# Patient Record
Sex: Male | Born: 1975
Health system: Southern US, Community
[De-identification: ages and names within clinical notes are randomized; demographics above are authoritative.]

## PROBLEM LIST (undated history)

## (undated) DIAGNOSIS — D141 Benign neoplasm of larynx: Secondary | ICD-10-CM

## (undated) DIAGNOSIS — J3089 Other allergic rhinitis: Secondary | ICD-10-CM

## (undated) DIAGNOSIS — K219 Gastro-esophageal reflux disease without esophagitis: Secondary | ICD-10-CM

## (undated) HISTORY — DX: Benign neoplasm of larynx: D14.1

## (undated) HISTORY — PX: OTHER SURGICAL HISTORY: SHX169

## (undated) HISTORY — DX: Other allergic rhinitis: J30.89

## (undated) HISTORY — DX: Gastro-esophageal reflux disease without esophagitis: K21.9

---

## 2012-07-04 ENCOUNTER — Ambulatory Visit (INDEPENDENT_AMBULATORY_CARE_PROVIDER_SITE_OTHER): Payer: BC Managed Care – PPO | Admitting: General Surgery

## 2012-07-27 ENCOUNTER — Encounter (INDEPENDENT_AMBULATORY_CARE_PROVIDER_SITE_OTHER): Payer: Self-pay | Admitting: General Surgery

## 2012-07-27 ENCOUNTER — Ambulatory Visit (INDEPENDENT_AMBULATORY_CARE_PROVIDER_SITE_OTHER): Payer: BC Managed Care – PPO | Admitting: General Surgery

## 2012-07-27 ENCOUNTER — Encounter (INDEPENDENT_AMBULATORY_CARE_PROVIDER_SITE_OTHER): Payer: Self-pay

## 2012-07-27 VITALS — BP 120/77 | HR 74 | Temp 97.6°F | Resp 16 | Ht 72.0 in | Wt 176.2 lb

## 2012-07-27 DIAGNOSIS — L723 Sebaceous cyst: Secondary | ICD-10-CM

## 2012-07-27 NOTE — Patient Instructions (Signed)
Please call if the area becomes infected prior to your surgery.

## 2012-07-27 NOTE — Progress Notes (Signed)
Patient ID: Steven Simmonds, Steven Potter, male   DOB: 1976-08-03, 36 y.o.   MRN: [id number]  Chief Complaint  Patient presents with  . Cyst    back    HPI Steven Simmonds, Steven Potter is a 36 y.o. male.   HPI  He is referred by Dr. Kirby Funk for evaluation of a cyst on the back. He states it has been infected and draining some. He states it looks fine now. Many years ago he had attempted removal of it but the area did not heal well.  Past Medical History  Diagnosis Date  . Recurrent glottic respiratory papillomatosis   . GERD (gastroesophageal reflux disease)   . Perennial allergic rhinitis     Past Surgical History  Procedure Date  . Removal of vocal chord papillomas   . Infected sebaceous cyst     back    Family History  Problem Relation Age of Onset  . Hypertension Father   . Cancer Brother     lung  . Heart disease Paternal Grandfather     AD    Social History History  Substance Use Topics  . Smoking status: Never Smoker   . Smokeless tobacco: Not on file  . Alcohol Use: Yes    No Known Allergies  Current Outpatient Prescriptions  Medication Sig Dispense Refill  . cetirizine (ZYRTEC) 10 MG tablet Take 10 mg by mouth daily.      . lansoprazole (PREVACID) 30 MG capsule Take 30 mg by mouth daily. Once daily before meal.      . Multiple Vitamin (MULTIVITAMIN) tablet Take 1 tablet by mouth daily.        Review of Systems Review of Systems  Constitutional: Negative.   Respiratory: Negative.   Cardiovascular: Negative.     Blood pressure 120/77, pulse 74, temperature 97.6 F (36.4 C), temperature source Temporal, resp. rate 16, height 6' (1.829 m), weight 176 lb 3.2 oz (79.924 kg).  Physical Exam Physical Exam  Constitutional: He appears well-developed and well-nourished. No distress.  Musculoskeletal:       In the left upper back scar is present and lateral to this was a 1.5 cm mobile subcutaneous mass with no erythema or drainage.    Data Reviewed Notes from Dr.  Jone Baseman office.  Assessment    Recurrent epidermoid cyst of back that is intermittently infected. No evidence of infection at this time.    Plan    Excision of recurrent epidermoid cyst of back. The procedure and risks have been discussed with him. Risks include but limited to bleeding, infection, wound healing problems.       Rocio Wolak J 07/27/2012, 4:21 PM

## 2012-08-28 ENCOUNTER — Ambulatory Visit (HOSPITAL_BASED_OUTPATIENT_CLINIC_OR_DEPARTMENT_OTHER)
Admission: RE | Admit: 2012-08-28 | Discharge: 2012-08-28 | Disposition: A | Discharge status: Home / Self Care | Payer: BC Managed Care – PPO | Source: Ambulatory Visit | Attending: General Surgery | Admitting: General Surgery

## 2012-08-28 ENCOUNTER — Encounter (HOSPITAL_BASED_OUTPATIENT_CLINIC_OR_DEPARTMENT_OTHER)
Admission: RE | Disposition: A | Discharge status: Home / Self Care | Payer: Self-pay | Source: Ambulatory Visit | Attending: General Surgery

## 2012-08-28 ENCOUNTER — Encounter (HOSPITAL_BASED_OUTPATIENT_CLINIC_OR_DEPARTMENT_OTHER): Payer: Self-pay

## 2012-08-28 DIAGNOSIS — L723 Sebaceous cyst: Secondary | ICD-10-CM | POA: Insufficient documentation

## 2012-08-28 HISTORY — PX: MASS EXCISION: SHX2000

## 2012-08-28 SURGERY — MINOR EXCISION OF MASS
Anesthesia: LOCAL | Site: Back | Wound class: Clean

## 2012-08-28 MED ORDER — SODIUM BICARBONATE 4 % IV SOLN
INTRAVENOUS | Status: DC | PRN
  Administered 2012-08-28: 16:00:00

## 2012-08-28 SURGICAL SUPPLY — 27 items
APL SKNCLS STERI-STRIP NONHPOA (GAUZE/BANDAGES/DRESSINGS) ×1
BENZOIN TINCTURE PRP APPL 2/3 (GAUZE/BANDAGES/DRESSINGS) ×2 IMPLANT
BLADE SURG 10 STRL SS (BLADE) IMPLANT
BLADE SURG 15 STRL LF DISP TIS (BLADE) ×1 IMPLANT
BLADE SURG 15 STRL SS (BLADE) ×2
CHLORAPREP W/TINT 26ML (MISCELLANEOUS) ×2 IMPLANT
CLOTH BEACON ORANGE TIMEOUT ST (SAFETY) ×1 IMPLANT
DRSG TEGADERM 4X4.75 (GAUZE/BANDAGES/DRESSINGS) ×1 IMPLANT
ELECT COATED BLADE 2.86 ST (ELECTRODE) IMPLANT
ELECT REM PT RETURN 9FT ADLT (ELECTROSURGICAL) ×2
ELECTRODE REM PT RTRN 9FT ADLT (ELECTROSURGICAL) IMPLANT
GAUZE SPONGE 4X4 12PLY STRL LF (GAUZE/BANDAGES/DRESSINGS) ×2 IMPLANT
GLOVE BIO SURGEON STRL SZ 6.5 (GLOVE) ×1 IMPLANT
GLOVE BIOGEL PI IND STRL 8.5 (GLOVE) ×1 IMPLANT
GLOVE BIOGEL PI INDICATOR 8.5 (GLOVE)
GLOVE ECLIPSE 8.0 STRL XLNG CF (GLOVE) ×2 IMPLANT
GOWN PREVENTION PLUS XLARGE (GOWN DISPOSABLE) ×1 IMPLANT
NDL HYPO 25X1 1.5 SAFETY (NEEDLE) ×1 IMPLANT
NEEDLE HYPO 25X1 1.5 SAFETY (NEEDLE) ×2 IMPLANT
PENCIL BUTTON HOLSTER BLD 10FT (ELECTRODE) ×1 IMPLANT
SPONGE GAUZE 2X2 8PLY STRL LF (GAUZE/BANDAGES/DRESSINGS) ×1 IMPLANT
SPONGE GAUZE 4X4 12PLY (GAUZE/BANDAGES/DRESSINGS) ×1 IMPLANT
STRIP CLOSURE SKIN 1/2X4 (GAUZE/BANDAGES/DRESSINGS) ×2 IMPLANT
SUT ETHILON 3 0 PS 1 (SUTURE) ×1 IMPLANT
SUT MON AB 4-0 PC3 18 (SUTURE) IMPLANT
SUT VICRYL 3-0 CR8 SH (SUTURE) IMPLANT
SYR CONTROL 10ML LL (SYRINGE) ×2 IMPLANT

## 2012-08-28 NOTE — H&P (Signed)
  Hx:  He has a recurrent cystic lesion on the back. He states it has been infected and draining some. He states it looks fine now. Many years ago he had attempted removal of it but the area did not heal well.  He is otherwise fairly healthy.  PE: General:  WDWN in NAD  Back:  Mid to upper back scar with subcutaneous mass lateral to the scar.  Assess:  Recurrent epidermoid cyst with recurring infections.  Plan:  Excision of cyst in minor procedure room.

## 2012-08-28 NOTE — Op Note (Signed)
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 It has recurred and become intermittently infected. It is not infected at this time. He presents for removal.    Procedure Detail:  He was seen in the holding area and brought to the operating room and placed prone on the operating table. The lesion in the left upper back was sterilely prepped and draped. Local anesthetic was infiltrated superficially and deep around the area. A elliptical incision was made in a transverse fashion through the skin and subcutaneous tissue. Previous scar was incised. The cystic lesion was removed sharply with normal surrounding tissue. It measured 1.5 cm. It was sent to pathology.  Bleeding was controlled with electrocautery. Once hemostasis was adequate the wound was closed with interrupted 3-0 nylon sutures. Antibiotic ointment and sterile dressing were applied. He tolerated the procedure well without any apparent complications.  Estimated Blood Loss:  Minimal         Drains: none  Blood Given: none          Specimens: Cystic lesion from left back        Complications:  * No complications entered in OR log *         Disposition: PACU - hemodynamically stable.         Condition: stable

## 2012-08-29 ENCOUNTER — Encounter (HOSPITAL_BASED_OUTPATIENT_CLINIC_OR_DEPARTMENT_OTHER): Payer: Self-pay | Admitting: General Surgery

## 2012-08-30 ENCOUNTER — Encounter (INDEPENDENT_AMBULATORY_CARE_PROVIDER_SITE_OTHER): Payer: Self-pay | Admitting: General Surgery

## 2012-08-30 NOTE — Progress Notes (Signed)
Patient ID: Steven Simmonds, MD, male   DOB: [birth year], 36 y.o.   MRN: [id number] Pathology demonstrates a benign epidermoid inclusion cyst. I called and discussed this with him.

## 2012-09-11 ENCOUNTER — Encounter (INDEPENDENT_AMBULATORY_CARE_PROVIDER_SITE_OTHER): Payer: Self-pay | Admitting: General Surgery

## 2012-09-11 ENCOUNTER — Ambulatory Visit (INDEPENDENT_AMBULATORY_CARE_PROVIDER_SITE_OTHER): Payer: BC Managed Care – PPO | Admitting: General Surgery

## 2012-09-11 VITALS — BP 118/70 | HR 72 | Temp 98.1°F | Resp 12 | Ht 72.0 in | Wt 181.4 lb

## 2012-09-11 DIAGNOSIS — Z9889 Other specified postprocedural states: Secondary | ICD-10-CM

## 2012-09-11 NOTE — Progress Notes (Signed)
Procedure: Removal of epidermoid cyst left back  Date:  08/28/2012  Pathology:  Benign epidermal inclusion cyst  History:  He is here for his first postoperative visit and has no complaints.  Exam: General- Is in NAD.  Left upper back wound is clean and intact with sutures. Sutures were removed and benzoin and Steri-Strips were applied.  Assessment:  Wound healing well and pathology is benign.  Plan:  Return visit when necessary.

## 2012-09-11 NOTE — Patient Instructions (Signed)
Call if you have any wound problems. 

## 2017-09-14 DIAGNOSIS — J301 Allergic rhinitis due to pollen: Secondary | ICD-10-CM | POA: Diagnosis not present

## 2017-09-14 DIAGNOSIS — J3089 Other allergic rhinitis: Secondary | ICD-10-CM | POA: Diagnosis not present

## 2017-09-19 DIAGNOSIS — J301 Allergic rhinitis due to pollen: Secondary | ICD-10-CM | POA: Diagnosis not present

## 2017-09-19 DIAGNOSIS — J3081 Allergic rhinitis due to animal (cat) (dog) hair and dander: Secondary | ICD-10-CM | POA: Diagnosis not present

## 2017-09-19 DIAGNOSIS — J3089 Other allergic rhinitis: Secondary | ICD-10-CM | POA: Diagnosis not present

## 2017-09-23 DIAGNOSIS — J301 Allergic rhinitis due to pollen: Secondary | ICD-10-CM | POA: Diagnosis not present

## 2017-09-23 DIAGNOSIS — J3089 Other allergic rhinitis: Secondary | ICD-10-CM | POA: Diagnosis not present

## 2017-09-23 DIAGNOSIS — J3081 Allergic rhinitis due to animal (cat) (dog) hair and dander: Secondary | ICD-10-CM | POA: Diagnosis not present

## 2017-09-27 DIAGNOSIS — J3089 Other allergic rhinitis: Secondary | ICD-10-CM | POA: Diagnosis not present

## 2017-09-27 DIAGNOSIS — J301 Allergic rhinitis due to pollen: Secondary | ICD-10-CM | POA: Diagnosis not present

## 2017-09-27 DIAGNOSIS — J3081 Allergic rhinitis due to animal (cat) (dog) hair and dander: Secondary | ICD-10-CM | POA: Diagnosis not present

## 2017-09-30 DIAGNOSIS — J301 Allergic rhinitis due to pollen: Secondary | ICD-10-CM | POA: Diagnosis not present

## 2017-09-30 DIAGNOSIS — J3089 Other allergic rhinitis: Secondary | ICD-10-CM | POA: Diagnosis not present

## 2017-09-30 DIAGNOSIS — J3081 Allergic rhinitis due to animal (cat) (dog) hair and dander: Secondary | ICD-10-CM | POA: Diagnosis not present

## 2017-10-06 DIAGNOSIS — J301 Allergic rhinitis due to pollen: Secondary | ICD-10-CM | POA: Diagnosis not present

## 2017-10-06 DIAGNOSIS — J3089 Other allergic rhinitis: Secondary | ICD-10-CM | POA: Diagnosis not present

## 2017-10-06 DIAGNOSIS — J3081 Allergic rhinitis due to animal (cat) (dog) hair and dander: Secondary | ICD-10-CM | POA: Diagnosis not present

## 2017-10-10 DIAGNOSIS — F4323 Adjustment disorder with mixed anxiety and depressed mood: Secondary | ICD-10-CM | POA: Diagnosis not present

## 2017-10-12 DIAGNOSIS — J3081 Allergic rhinitis due to animal (cat) (dog) hair and dander: Secondary | ICD-10-CM | POA: Diagnosis not present

## 2017-10-12 DIAGNOSIS — J3089 Other allergic rhinitis: Secondary | ICD-10-CM | POA: Diagnosis not present

## 2017-10-12 DIAGNOSIS — J301 Allergic rhinitis due to pollen: Secondary | ICD-10-CM | POA: Diagnosis not present

## 2017-10-19 DIAGNOSIS — J3081 Allergic rhinitis due to animal (cat) (dog) hair and dander: Secondary | ICD-10-CM | POA: Diagnosis not present

## 2017-10-19 DIAGNOSIS — J3089 Other allergic rhinitis: Secondary | ICD-10-CM | POA: Diagnosis not present

## 2017-10-19 DIAGNOSIS — J301 Allergic rhinitis due to pollen: Secondary | ICD-10-CM | POA: Diagnosis not present

## 2017-10-24 DIAGNOSIS — F4323 Adjustment disorder with mixed anxiety and depressed mood: Secondary | ICD-10-CM | POA: Diagnosis not present

## 2017-10-25 DIAGNOSIS — J3081 Allergic rhinitis due to animal (cat) (dog) hair and dander: Secondary | ICD-10-CM | POA: Diagnosis not present

## 2017-10-25 DIAGNOSIS — J3089 Other allergic rhinitis: Secondary | ICD-10-CM | POA: Diagnosis not present

## 2017-10-25 DIAGNOSIS — J301 Allergic rhinitis due to pollen: Secondary | ICD-10-CM | POA: Diagnosis not present

## 2017-11-03 DIAGNOSIS — J301 Allergic rhinitis due to pollen: Secondary | ICD-10-CM | POA: Diagnosis not present

## 2017-11-03 DIAGNOSIS — J3081 Allergic rhinitis due to animal (cat) (dog) hair and dander: Secondary | ICD-10-CM | POA: Diagnosis not present

## 2017-11-03 DIAGNOSIS — J3089 Other allergic rhinitis: Secondary | ICD-10-CM | POA: Diagnosis not present

## 2017-11-09 DIAGNOSIS — J3081 Allergic rhinitis due to animal (cat) (dog) hair and dander: Secondary | ICD-10-CM | POA: Diagnosis not present

## 2017-11-09 DIAGNOSIS — J3089 Other allergic rhinitis: Secondary | ICD-10-CM | POA: Diagnosis not present

## 2017-11-09 DIAGNOSIS — J301 Allergic rhinitis due to pollen: Secondary | ICD-10-CM | POA: Diagnosis not present

## 2017-11-14 DIAGNOSIS — F4323 Adjustment disorder with mixed anxiety and depressed mood: Secondary | ICD-10-CM | POA: Diagnosis not present

## 2017-11-17 DIAGNOSIS — J3089 Other allergic rhinitis: Secondary | ICD-10-CM | POA: Diagnosis not present

## 2017-11-17 DIAGNOSIS — J301 Allergic rhinitis due to pollen: Secondary | ICD-10-CM | POA: Diagnosis not present

## 2017-11-17 DIAGNOSIS — J3081 Allergic rhinitis due to animal (cat) (dog) hair and dander: Secondary | ICD-10-CM | POA: Diagnosis not present

## 2017-11-23 DIAGNOSIS — J3089 Other allergic rhinitis: Secondary | ICD-10-CM | POA: Diagnosis not present

## 2017-11-23 DIAGNOSIS — J301 Allergic rhinitis due to pollen: Secondary | ICD-10-CM | POA: Diagnosis not present

## 2017-11-23 DIAGNOSIS — J3081 Allergic rhinitis due to animal (cat) (dog) hair and dander: Secondary | ICD-10-CM | POA: Diagnosis not present

## 2017-11-30 DIAGNOSIS — J301 Allergic rhinitis due to pollen: Secondary | ICD-10-CM | POA: Diagnosis not present

## 2017-11-30 DIAGNOSIS — J3081 Allergic rhinitis due to animal (cat) (dog) hair and dander: Secondary | ICD-10-CM | POA: Diagnosis not present

## 2017-11-30 DIAGNOSIS — J3089 Other allergic rhinitis: Secondary | ICD-10-CM | POA: Diagnosis not present

## 2017-12-05 DIAGNOSIS — F4323 Adjustment disorder with mixed anxiety and depressed mood: Secondary | ICD-10-CM | POA: Diagnosis not present

## 2017-12-08 DIAGNOSIS — J301 Allergic rhinitis due to pollen: Secondary | ICD-10-CM | POA: Diagnosis not present

## 2017-12-08 DIAGNOSIS — J3081 Allergic rhinitis due to animal (cat) (dog) hair and dander: Secondary | ICD-10-CM | POA: Diagnosis not present

## 2017-12-08 DIAGNOSIS — J3089 Other allergic rhinitis: Secondary | ICD-10-CM | POA: Diagnosis not present

## 2017-12-14 DIAGNOSIS — J301 Allergic rhinitis due to pollen: Secondary | ICD-10-CM | POA: Diagnosis not present

## 2017-12-14 DIAGNOSIS — J3089 Other allergic rhinitis: Secondary | ICD-10-CM | POA: Diagnosis not present

## 2017-12-14 DIAGNOSIS — J3081 Allergic rhinitis due to animal (cat) (dog) hair and dander: Secondary | ICD-10-CM | POA: Diagnosis not present

## 2017-12-22 DIAGNOSIS — J3089 Other allergic rhinitis: Secondary | ICD-10-CM | POA: Diagnosis not present

## 2017-12-22 DIAGNOSIS — J301 Allergic rhinitis due to pollen: Secondary | ICD-10-CM | POA: Diagnosis not present

## 2017-12-22 DIAGNOSIS — J3081 Allergic rhinitis due to animal (cat) (dog) hair and dander: Secondary | ICD-10-CM | POA: Diagnosis not present

## 2017-12-27 DIAGNOSIS — J3081 Allergic rhinitis due to animal (cat) (dog) hair and dander: Secondary | ICD-10-CM | POA: Diagnosis not present

## 2017-12-27 DIAGNOSIS — J301 Allergic rhinitis due to pollen: Secondary | ICD-10-CM | POA: Diagnosis not present

## 2017-12-28 DIAGNOSIS — J3089 Other allergic rhinitis: Secondary | ICD-10-CM | POA: Diagnosis not present

## 2017-12-28 DIAGNOSIS — J301 Allergic rhinitis due to pollen: Secondary | ICD-10-CM | POA: Diagnosis not present

## 2017-12-28 DIAGNOSIS — J3081 Allergic rhinitis due to animal (cat) (dog) hair and dander: Secondary | ICD-10-CM | POA: Diagnosis not present

## 2018-01-03 DIAGNOSIS — J301 Allergic rhinitis due to pollen: Secondary | ICD-10-CM | POA: Diagnosis not present

## 2018-01-03 DIAGNOSIS — J3081 Allergic rhinitis due to animal (cat) (dog) hair and dander: Secondary | ICD-10-CM | POA: Diagnosis not present

## 2018-01-03 DIAGNOSIS — J3089 Other allergic rhinitis: Secondary | ICD-10-CM | POA: Diagnosis not present

## 2018-01-11 DIAGNOSIS — J3081 Allergic rhinitis due to animal (cat) (dog) hair and dander: Secondary | ICD-10-CM | POA: Diagnosis not present

## 2018-01-11 DIAGNOSIS — J301 Allergic rhinitis due to pollen: Secondary | ICD-10-CM | POA: Diagnosis not present

## 2018-01-11 DIAGNOSIS — J3089 Other allergic rhinitis: Secondary | ICD-10-CM | POA: Diagnosis not present

## 2018-01-18 DIAGNOSIS — J3089 Other allergic rhinitis: Secondary | ICD-10-CM | POA: Diagnosis not present

## 2018-01-18 DIAGNOSIS — J3081 Allergic rhinitis due to animal (cat) (dog) hair and dander: Secondary | ICD-10-CM | POA: Diagnosis not present

## 2018-01-18 DIAGNOSIS — J301 Allergic rhinitis due to pollen: Secondary | ICD-10-CM | POA: Diagnosis not present

## 2018-01-23 DIAGNOSIS — J3081 Allergic rhinitis due to animal (cat) (dog) hair and dander: Secondary | ICD-10-CM | POA: Diagnosis not present

## 2018-01-23 DIAGNOSIS — J301 Allergic rhinitis due to pollen: Secondary | ICD-10-CM | POA: Diagnosis not present

## 2018-01-23 DIAGNOSIS — J3089 Other allergic rhinitis: Secondary | ICD-10-CM | POA: Diagnosis not present

## 2018-01-30 DIAGNOSIS — J3089 Other allergic rhinitis: Secondary | ICD-10-CM | POA: Diagnosis not present

## 2018-01-30 DIAGNOSIS — J3081 Allergic rhinitis due to animal (cat) (dog) hair and dander: Secondary | ICD-10-CM | POA: Diagnosis not present

## 2018-01-30 DIAGNOSIS — J301 Allergic rhinitis due to pollen: Secondary | ICD-10-CM | POA: Diagnosis not present

## 2018-02-02 DIAGNOSIS — J301 Allergic rhinitis due to pollen: Secondary | ICD-10-CM | POA: Diagnosis not present

## 2018-02-02 DIAGNOSIS — J3089 Other allergic rhinitis: Secondary | ICD-10-CM | POA: Diagnosis not present

## 2018-02-02 DIAGNOSIS — J3081 Allergic rhinitis due to animal (cat) (dog) hair and dander: Secondary | ICD-10-CM | POA: Diagnosis not present

## 2018-02-07 DIAGNOSIS — J3089 Other allergic rhinitis: Secondary | ICD-10-CM | POA: Diagnosis not present

## 2018-02-07 DIAGNOSIS — J3081 Allergic rhinitis due to animal (cat) (dog) hair and dander: Secondary | ICD-10-CM | POA: Diagnosis not present

## 2018-02-07 DIAGNOSIS — J301 Allergic rhinitis due to pollen: Secondary | ICD-10-CM | POA: Diagnosis not present

## 2018-02-16 DIAGNOSIS — J3089 Other allergic rhinitis: Secondary | ICD-10-CM | POA: Diagnosis not present

## 2018-02-16 DIAGNOSIS — J301 Allergic rhinitis due to pollen: Secondary | ICD-10-CM | POA: Diagnosis not present

## 2018-02-16 DIAGNOSIS — J3081 Allergic rhinitis due to animal (cat) (dog) hair and dander: Secondary | ICD-10-CM | POA: Diagnosis not present

## 2018-03-06 DIAGNOSIS — J301 Allergic rhinitis due to pollen: Secondary | ICD-10-CM | POA: Diagnosis not present

## 2018-03-06 DIAGNOSIS — J3081 Allergic rhinitis due to animal (cat) (dog) hair and dander: Secondary | ICD-10-CM | POA: Diagnosis not present

## 2018-03-06 DIAGNOSIS — J3089 Other allergic rhinitis: Secondary | ICD-10-CM | POA: Diagnosis not present

## 2018-03-06 DIAGNOSIS — M79641 Pain in right hand: Secondary | ICD-10-CM | POA: Diagnosis not present

## 2018-03-15 DIAGNOSIS — J3081 Allergic rhinitis due to animal (cat) (dog) hair and dander: Secondary | ICD-10-CM | POA: Diagnosis not present

## 2018-03-15 DIAGNOSIS — J301 Allergic rhinitis due to pollen: Secondary | ICD-10-CM | POA: Diagnosis not present

## 2018-03-15 DIAGNOSIS — J3089 Other allergic rhinitis: Secondary | ICD-10-CM | POA: Diagnosis not present

## 2018-03-24 DIAGNOSIS — J3081 Allergic rhinitis due to animal (cat) (dog) hair and dander: Secondary | ICD-10-CM | POA: Diagnosis not present

## 2018-03-24 DIAGNOSIS — J301 Allergic rhinitis due to pollen: Secondary | ICD-10-CM | POA: Diagnosis not present

## 2018-03-24 DIAGNOSIS — J3089 Other allergic rhinitis: Secondary | ICD-10-CM | POA: Diagnosis not present

## 2018-04-03 DIAGNOSIS — J301 Allergic rhinitis due to pollen: Secondary | ICD-10-CM | POA: Diagnosis not present

## 2018-04-03 DIAGNOSIS — J3081 Allergic rhinitis due to animal (cat) (dog) hair and dander: Secondary | ICD-10-CM | POA: Diagnosis not present

## 2018-04-03 DIAGNOSIS — J3089 Other allergic rhinitis: Secondary | ICD-10-CM | POA: Diagnosis not present

## 2018-04-14 DIAGNOSIS — J3081 Allergic rhinitis due to animal (cat) (dog) hair and dander: Secondary | ICD-10-CM | POA: Diagnosis not present

## 2018-04-14 DIAGNOSIS — J3089 Other allergic rhinitis: Secondary | ICD-10-CM | POA: Diagnosis not present

## 2018-04-14 DIAGNOSIS — J301 Allergic rhinitis due to pollen: Secondary | ICD-10-CM | POA: Diagnosis not present

## 2018-05-01 DIAGNOSIS — J3089 Other allergic rhinitis: Secondary | ICD-10-CM | POA: Diagnosis not present

## 2018-05-01 DIAGNOSIS — J301 Allergic rhinitis due to pollen: Secondary | ICD-10-CM | POA: Diagnosis not present

## 2018-05-01 DIAGNOSIS — J3081 Allergic rhinitis due to animal (cat) (dog) hair and dander: Secondary | ICD-10-CM | POA: Diagnosis not present

## 2018-05-11 DIAGNOSIS — J301 Allergic rhinitis due to pollen: Secondary | ICD-10-CM | POA: Diagnosis not present

## 2018-05-11 DIAGNOSIS — J3089 Other allergic rhinitis: Secondary | ICD-10-CM | POA: Diagnosis not present

## 2018-05-11 DIAGNOSIS — J3081 Allergic rhinitis due to animal (cat) (dog) hair and dander: Secondary | ICD-10-CM | POA: Diagnosis not present

## 2018-05-22 DIAGNOSIS — J3081 Allergic rhinitis due to animal (cat) (dog) hair and dander: Secondary | ICD-10-CM | POA: Diagnosis not present

## 2018-05-22 DIAGNOSIS — J3089 Other allergic rhinitis: Secondary | ICD-10-CM | POA: Diagnosis not present

## 2018-05-22 DIAGNOSIS — J301 Allergic rhinitis due to pollen: Secondary | ICD-10-CM | POA: Diagnosis not present

## 2018-06-02 DIAGNOSIS — J3089 Other allergic rhinitis: Secondary | ICD-10-CM | POA: Diagnosis not present

## 2018-06-02 DIAGNOSIS — J3081 Allergic rhinitis due to animal (cat) (dog) hair and dander: Secondary | ICD-10-CM | POA: Diagnosis not present

## 2018-06-02 DIAGNOSIS — J301 Allergic rhinitis due to pollen: Secondary | ICD-10-CM | POA: Diagnosis not present

## 2018-06-23 DIAGNOSIS — J301 Allergic rhinitis due to pollen: Secondary | ICD-10-CM | POA: Diagnosis not present

## 2018-06-23 DIAGNOSIS — J3081 Allergic rhinitis due to animal (cat) (dog) hair and dander: Secondary | ICD-10-CM | POA: Diagnosis not present

## 2018-06-23 DIAGNOSIS — J3089 Other allergic rhinitis: Secondary | ICD-10-CM | POA: Diagnosis not present

## 2018-07-06 DIAGNOSIS — J301 Allergic rhinitis due to pollen: Secondary | ICD-10-CM | POA: Diagnosis not present

## 2018-07-06 DIAGNOSIS — J3081 Allergic rhinitis due to animal (cat) (dog) hair and dander: Secondary | ICD-10-CM | POA: Diagnosis not present

## 2018-07-06 DIAGNOSIS — J3089 Other allergic rhinitis: Secondary | ICD-10-CM | POA: Diagnosis not present

## 2018-07-13 DIAGNOSIS — J3081 Allergic rhinitis due to animal (cat) (dog) hair and dander: Secondary | ICD-10-CM | POA: Diagnosis not present

## 2018-07-13 DIAGNOSIS — J3089 Other allergic rhinitis: Secondary | ICD-10-CM | POA: Diagnosis not present

## 2018-07-13 DIAGNOSIS — J301 Allergic rhinitis due to pollen: Secondary | ICD-10-CM | POA: Diagnosis not present

## 2018-07-31 DIAGNOSIS — J3081 Allergic rhinitis due to animal (cat) (dog) hair and dander: Secondary | ICD-10-CM | POA: Diagnosis not present

## 2018-07-31 DIAGNOSIS — J3089 Other allergic rhinitis: Secondary | ICD-10-CM | POA: Diagnosis not present

## 2018-07-31 DIAGNOSIS — J301 Allergic rhinitis due to pollen: Secondary | ICD-10-CM | POA: Diagnosis not present

## 2018-08-16 DIAGNOSIS — J3081 Allergic rhinitis due to animal (cat) (dog) hair and dander: Secondary | ICD-10-CM | POA: Diagnosis not present

## 2018-08-16 DIAGNOSIS — J301 Allergic rhinitis due to pollen: Secondary | ICD-10-CM | POA: Diagnosis not present

## 2018-08-16 DIAGNOSIS — J3089 Other allergic rhinitis: Secondary | ICD-10-CM | POA: Diagnosis not present

## 2018-08-21 DIAGNOSIS — J301 Allergic rhinitis due to pollen: Secondary | ICD-10-CM | POA: Diagnosis not present

## 2018-08-21 DIAGNOSIS — J3081 Allergic rhinitis due to animal (cat) (dog) hair and dander: Secondary | ICD-10-CM | POA: Diagnosis not present

## 2018-08-22 DIAGNOSIS — J3089 Other allergic rhinitis: Secondary | ICD-10-CM | POA: Diagnosis not present

## 2018-08-31 DIAGNOSIS — J3089 Other allergic rhinitis: Secondary | ICD-10-CM | POA: Diagnosis not present

## 2018-08-31 DIAGNOSIS — J301 Allergic rhinitis due to pollen: Secondary | ICD-10-CM | POA: Diagnosis not present

## 2018-08-31 DIAGNOSIS — J3081 Allergic rhinitis due to animal (cat) (dog) hair and dander: Secondary | ICD-10-CM | POA: Diagnosis not present

## 2018-09-18 DIAGNOSIS — J301 Allergic rhinitis due to pollen: Secondary | ICD-10-CM | POA: Diagnosis not present

## 2018-09-18 DIAGNOSIS — J3081 Allergic rhinitis due to animal (cat) (dog) hair and dander: Secondary | ICD-10-CM | POA: Diagnosis not present

## 2018-09-18 DIAGNOSIS — J3089 Other allergic rhinitis: Secondary | ICD-10-CM | POA: Diagnosis not present

## 2018-09-27 DIAGNOSIS — B349 Viral infection, unspecified: Secondary | ICD-10-CM | POA: Diagnosis not present

## 2018-09-27 DIAGNOSIS — J069 Acute upper respiratory infection, unspecified: Secondary | ICD-10-CM | POA: Diagnosis not present

## 2018-09-29 DIAGNOSIS — J3081 Allergic rhinitis due to animal (cat) (dog) hair and dander: Secondary | ICD-10-CM | POA: Diagnosis not present

## 2018-09-29 DIAGNOSIS — J3089 Other allergic rhinitis: Secondary | ICD-10-CM | POA: Diagnosis not present

## 2018-09-29 DIAGNOSIS — J301 Allergic rhinitis due to pollen: Secondary | ICD-10-CM | POA: Diagnosis not present

## 2018-10-10 DIAGNOSIS — J3081 Allergic rhinitis due to animal (cat) (dog) hair and dander: Secondary | ICD-10-CM | POA: Diagnosis not present

## 2018-10-10 DIAGNOSIS — J301 Allergic rhinitis due to pollen: Secondary | ICD-10-CM | POA: Diagnosis not present

## 2018-10-10 DIAGNOSIS — J3089 Other allergic rhinitis: Secondary | ICD-10-CM | POA: Diagnosis not present

## 2018-10-30 DIAGNOSIS — J3081 Allergic rhinitis due to animal (cat) (dog) hair and dander: Secondary | ICD-10-CM | POA: Diagnosis not present

## 2018-10-30 DIAGNOSIS — J301 Allergic rhinitis due to pollen: Secondary | ICD-10-CM | POA: Diagnosis not present

## 2018-10-30 DIAGNOSIS — J3089 Other allergic rhinitis: Secondary | ICD-10-CM | POA: Diagnosis not present

## 2018-11-13 DIAGNOSIS — L720 Epidermal cyst: Secondary | ICD-10-CM | POA: Diagnosis not present

## 2018-11-15 DIAGNOSIS — J101 Influenza due to other identified influenza virus with other respiratory manifestations: Secondary | ICD-10-CM | POA: Diagnosis not present

## 2018-11-18 DIAGNOSIS — J111 Influenza due to unidentified influenza virus with other respiratory manifestations: Secondary | ICD-10-CM | POA: Diagnosis not present

## 2019-03-21 DIAGNOSIS — J301 Allergic rhinitis due to pollen: Secondary | ICD-10-CM | POA: Diagnosis not present

## 2019-03-21 DIAGNOSIS — J3089 Other allergic rhinitis: Secondary | ICD-10-CM | POA: Diagnosis not present

## 2019-05-22 DIAGNOSIS — Z13220 Encounter for screening for lipoid disorders: Secondary | ICD-10-CM | POA: Diagnosis not present

## 2019-05-22 DIAGNOSIS — Z0000 Encounter for general adult medical examination without abnormal findings: Secondary | ICD-10-CM | POA: Diagnosis not present

## 2019-05-22 DIAGNOSIS — K219 Gastro-esophageal reflux disease without esophagitis: Secondary | ICD-10-CM | POA: Diagnosis not present

## 2019-05-22 DIAGNOSIS — J3089 Other allergic rhinitis: Secondary | ICD-10-CM | POA: Diagnosis not present

## 2020-04-30 DIAGNOSIS — J301 Allergic rhinitis due to pollen: Secondary | ICD-10-CM | POA: Diagnosis not present

## 2020-04-30 DIAGNOSIS — J3089 Other allergic rhinitis: Secondary | ICD-10-CM | POA: Diagnosis not present

## 2020-11-19 ENCOUNTER — Other Ambulatory Visit: Payer: Self-pay | Admitting: Internal Medicine

## 2020-11-19 ENCOUNTER — Ambulatory Visit
Admission: RE | Admit: 2020-11-19 | Discharge: 2020-11-19 | Disposition: A | Discharge status: Home / Self Care | Payer: BC Managed Care – PPO | Source: Ambulatory Visit | Attending: Internal Medicine | Admitting: Internal Medicine

## 2020-11-19 DIAGNOSIS — R079 Chest pain, unspecified: Secondary | ICD-10-CM | POA: Diagnosis not present

## 2020-11-19 DIAGNOSIS — R0789 Other chest pain: Secondary | ICD-10-CM

## 2020-11-19 IMAGING — CR DG RIBS W/ CHEST 3+V*R*
3 series · 3 of 3 positions shown · non-contrast
Comparison: None.

CLINICAL DATA: Right-sided chest pain for several months, no known
injury, initial encounter

EXAM:
RIGHT RIBS AND CHEST - 3+ VIEW

[w chest pa]
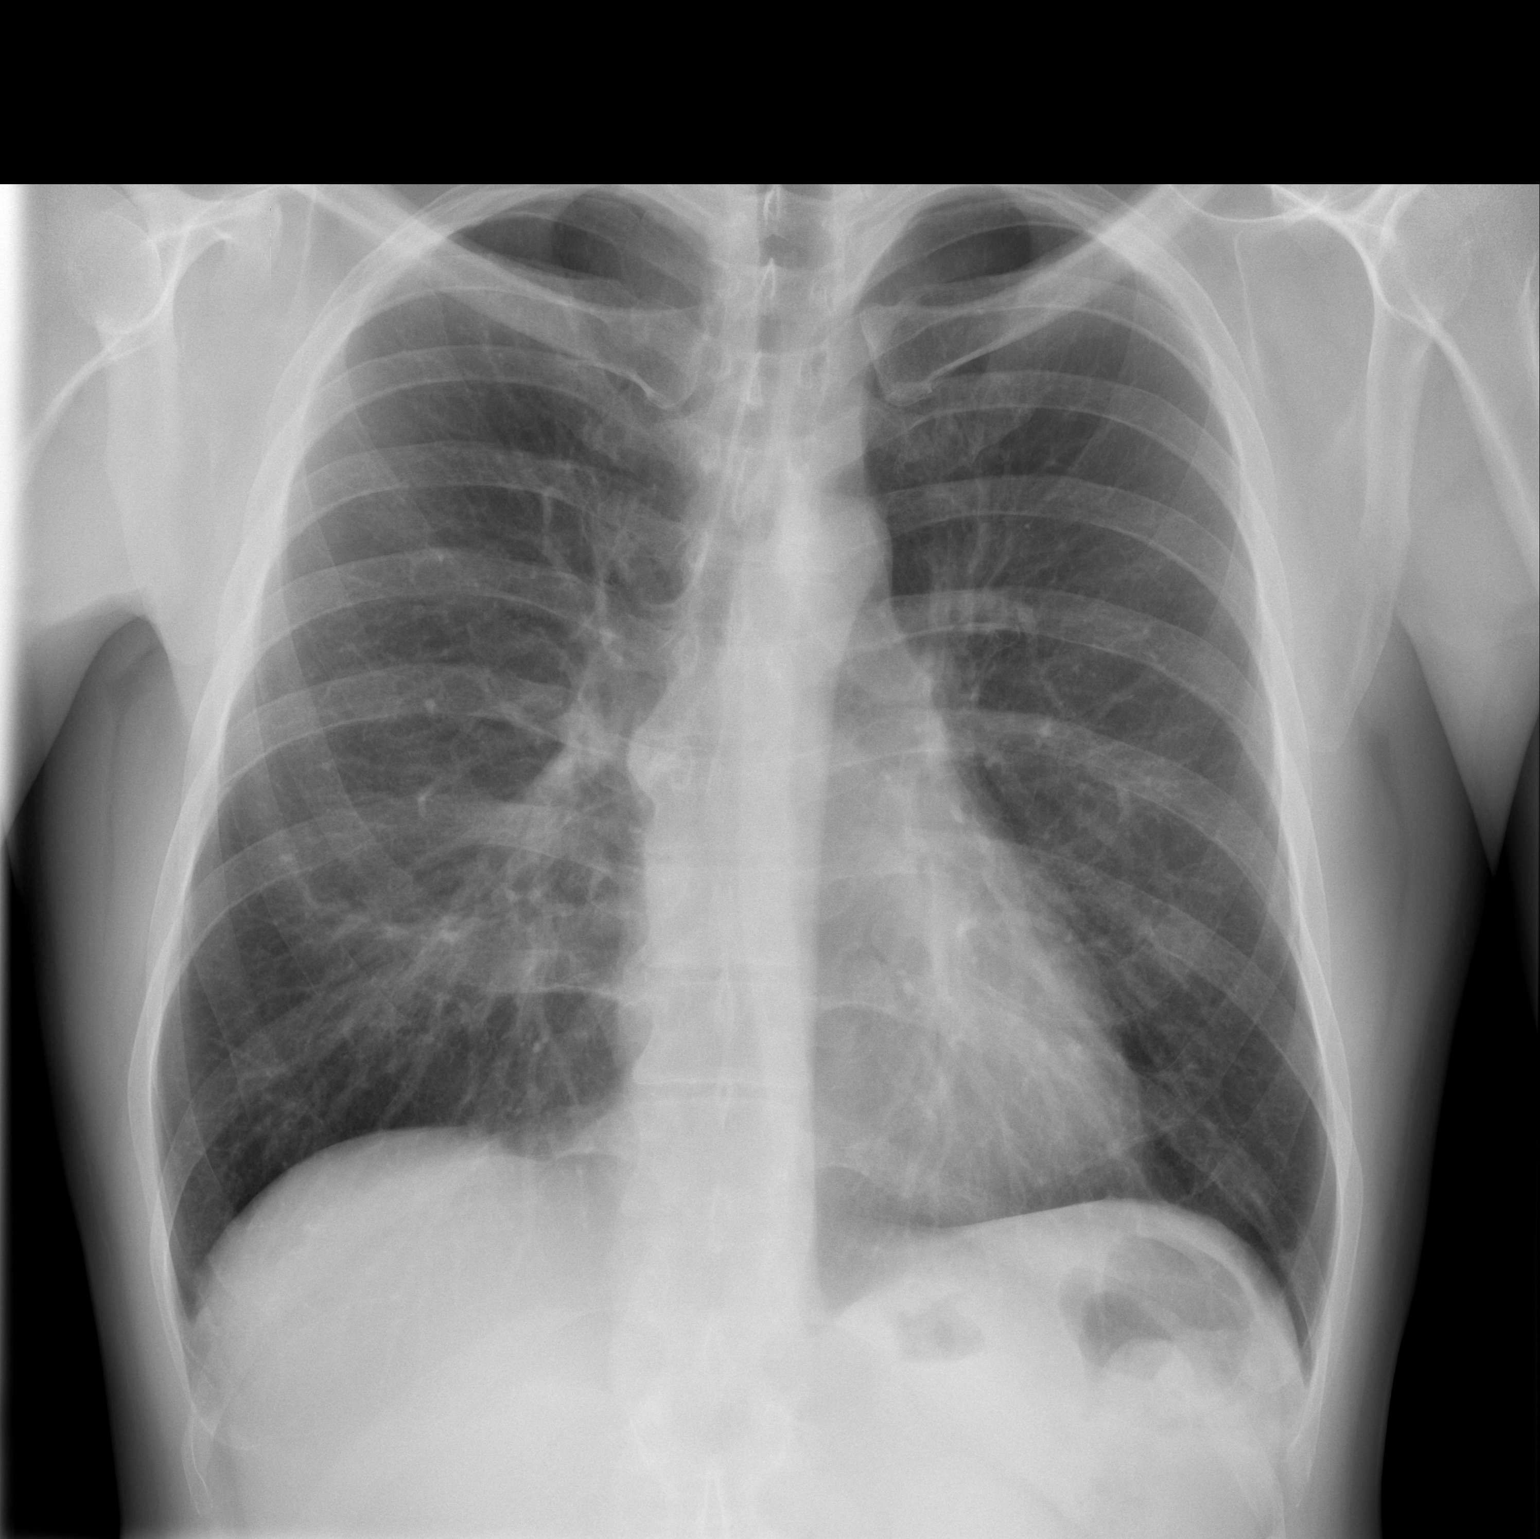

[w ribs ap/pa upper right *]
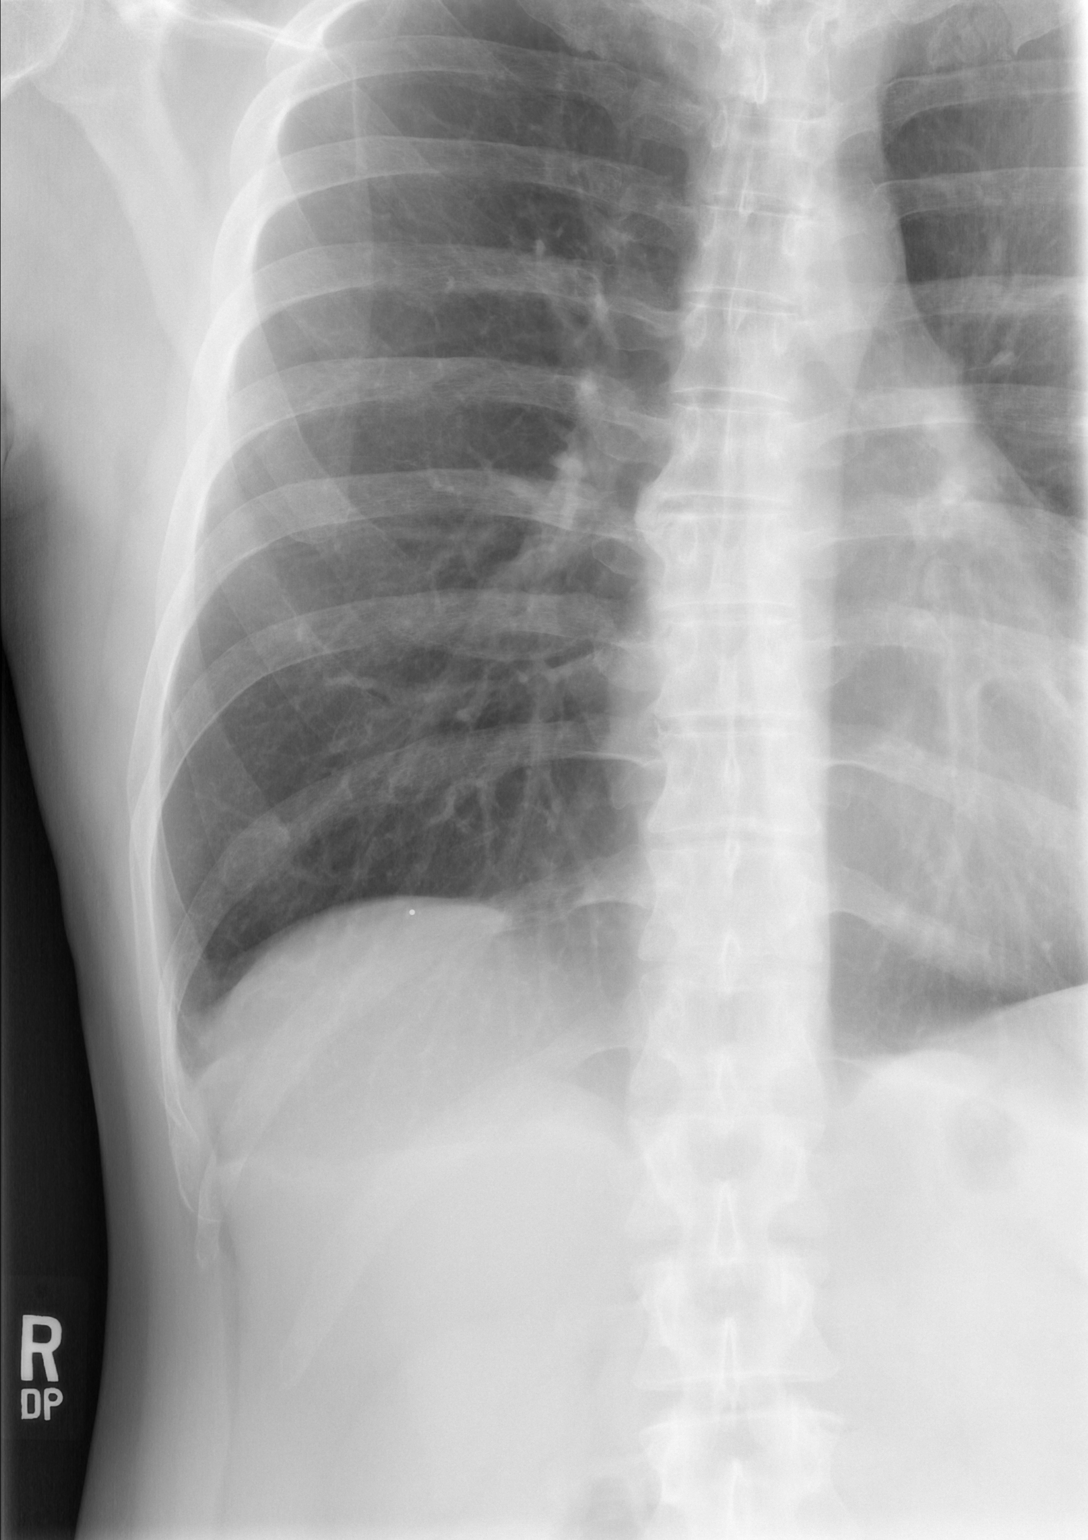

[w ribs oblique right *]
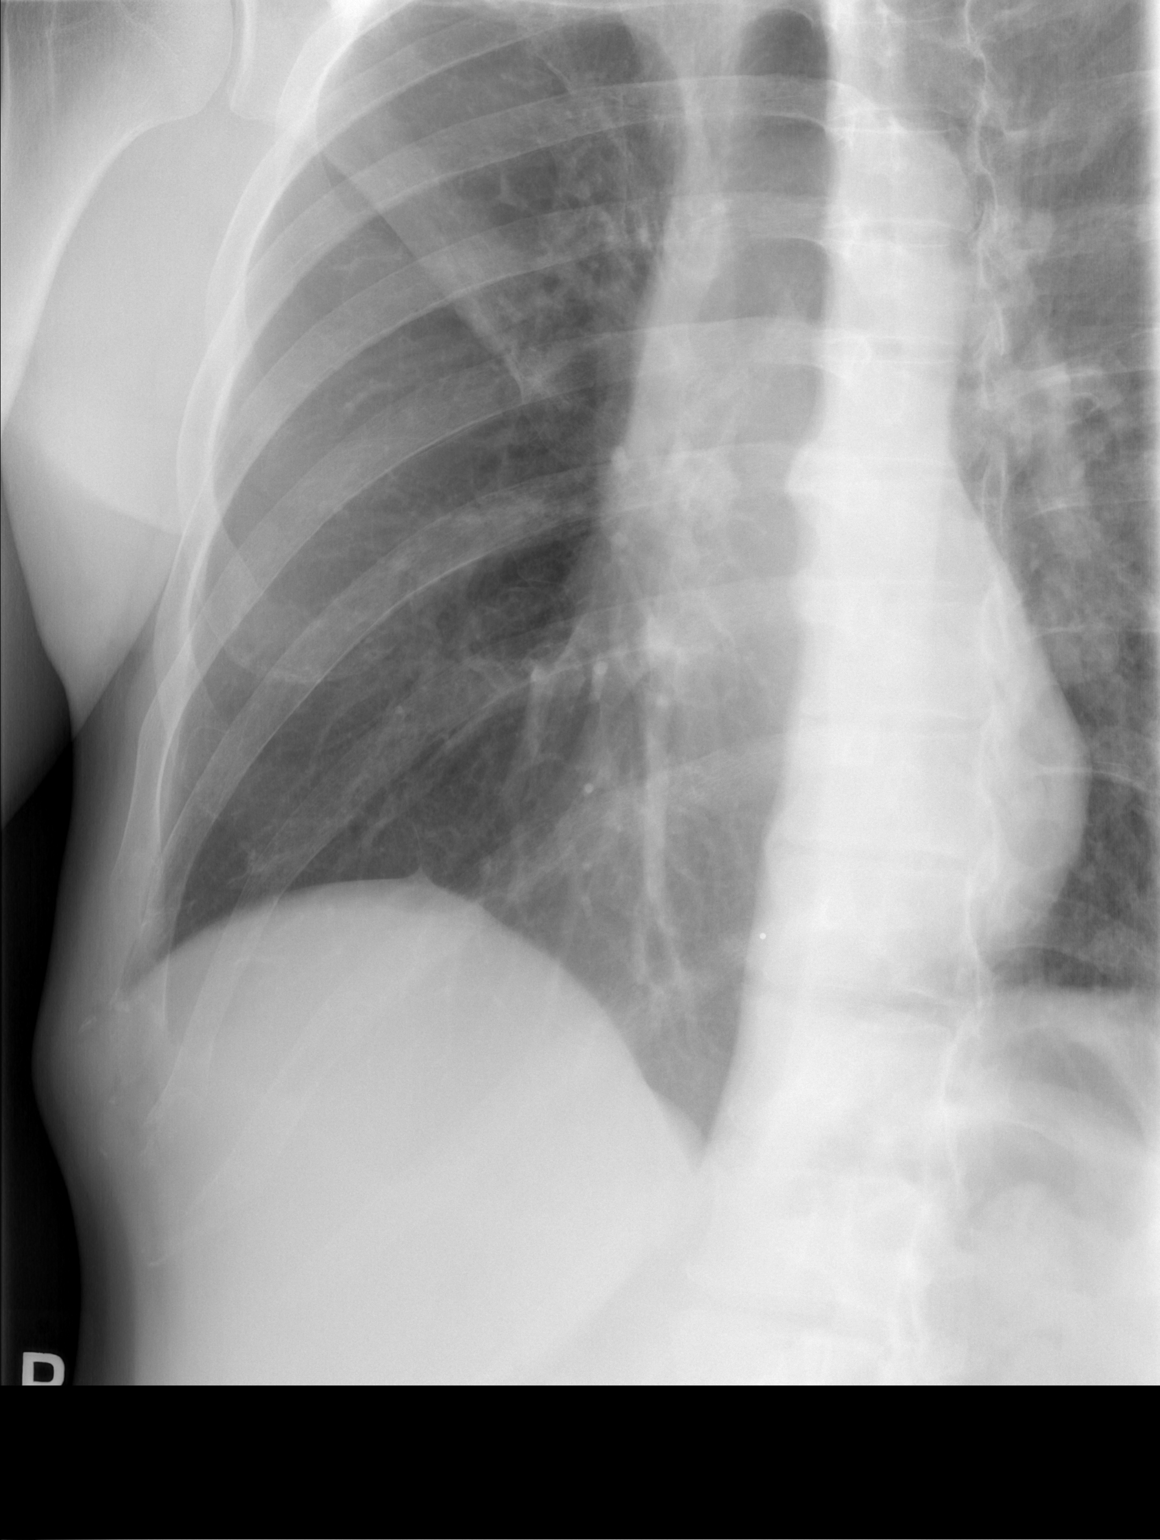

[3 of 3 positions shown; findings below may reference images not displayed]

FINDINGS: Cardiac shadow is within normal limits. The lungs are free of acute
infiltrate or sizable effusion. Bony structures are within normal
limits. No acute rib fractures are seen.
IMPRESSION: No acute abnormality noted.

## 2020-12-01 DIAGNOSIS — D141 Benign neoplasm of larynx: Secondary | ICD-10-CM | POA: Diagnosis not present

## 2020-12-01 DIAGNOSIS — J301 Allergic rhinitis due to pollen: Secondary | ICD-10-CM | POA: Diagnosis not present

## 2021-01-05 ENCOUNTER — Encounter (HOSPITAL_BASED_OUTPATIENT_CLINIC_OR_DEPARTMENT_OTHER): Payer: Self-pay | Admitting: Otolaryngology

## 2021-01-05 ENCOUNTER — Other Ambulatory Visit: Payer: Self-pay

## 2021-01-08 ENCOUNTER — Other Ambulatory Visit (HOSPITAL_COMMUNITY): Payer: BC Managed Care – PPO

## 2021-01-08 NOTE — H&P (Signed)
HPI:   Steven Potter is a 45 y.o. male who presents as a consult Patient.   Referring Provider: Irven Shelling, MD  Chief complaint: Check throat.  HPI: History of recurrent laryngeal papilloma. Having some hoarseness that is worsening over the past several months. He drinks 32 ounces of coffee daily and a couple of alcoholic drinks on a daily basis. He has a lot of throat clearing and chronic postnasal drainage. He suffers with allergic rhinitis. Allergy medicine has not really helped him very much.  PMH/Meds/All/SocHx/FamHx/ROS:   Past Medical History:  Diagnosis Date  . Heartburn  . Hoarseness  . Recurrent glottic respiratory papillomatosis   Past Surgical History:  Procedure Laterality Date  . direct laryngoscopy with excisio pappilloma 10/31/2015   No family history of bleeding disorders, wound healing problems or difficulty with anesthesia.   Social History   Socioeconomic History  . Marital status: Married  Spouse name: Not on file  . Number of children: Not on file  . Years of education: Not on file  . Highest education level: Not on file  Occupational History  . Not on file  Tobacco Use  . Smoking status: Never Smoker  . Smokeless tobacco: Never Used  Vaping Use  . Vaping Use: Never used  Substance and Sexual Activity  . Alcohol use: Not on file  . Drug use: Not on file  . Sexual activity: Not on file  Other Topics Concern  . Not on file  Social History Narrative  . Not on file   Social Determinants of Health   Financial Resource Strain: Not on file  Food Insecurity: Not on file  Transportation Needs: Not on file  Physical Activity: Not on file  Stress: Not on file  Social Connections: Not on file  Housing Stability: Not on file   Current Outpatient Medications:  . EPINEPHrine (EPIPEN) 0.3 mg/0.3 mL auto-injector, epinephrine 0.3 mg/0.3 mL injection, auto-injector, Disp: , Rfl:  . levocetirizine (XYZAL) 5 MG tablet, levocetirizine 5 mg  tablet, Disp: , Rfl:  . montelukast (SINGULAIR) 10 mg tablet, TAKE 1 TABLET EVERY EVENING 90, Disp: , Rfl:  . multivitamin (TAB-A-VITE) tablet, Take 1 tablet by mouth., Disp: , Rfl:  . RABEprazole (ACIPHEX) 20 mg DR tablet, Take 20 mg by mouth daily., Disp: , Rfl:  . triamcinolone acetonide (NASACORT NASL), by Nasal route., Disp: , Rfl:   A complete ROS was performed with pertinent positives/negatives noted in the HPI. The remainder of the ROS are negative.   Physical Exam:   BP 107/82  Pulse 89  Temp 96.9 F (36.1 C)  Ht 1.829 m (6')  Wt 78.9 kg (174 lb)  BMI 23.60 kg/m   General: Healthy and alert, in no distress, breathing easily. Normal affect. In a pleasant mood. Head: Normocephalic, atraumatic. No masses, or scars. Eyes: Pupils are equal, and reactive to light. Vision is grossly intact. No spontaneous or gaze nystagmus. Ears: Ear canals are clear. Tympanic membranes are intact, with normal landmarks and the middle ears are clear and healthy. Hearing: Grossly normal. Nose: Nasal cavities are clear with healthy mucosa, no polyps or exudate. Airways are patent. Face: No masses or scars, facial nerve function is symmetric. Oral Cavity: No mucosal abnormalities are noted. Tongue with normal mobility. Dentition appears healthy. Oropharynx: Tonsils are symmetric. There are no mucosal masses identified. Tongue base appears normal and healthy. Larynx/Hypopharynx: Exam limited. Chest: Deferred Neck: No palpable masses, no cervical adenopathy, no thyroid nodules or enlargement. Neuro: Cranial nerves II-XII with  normal function. Balance: Normal gate. Other findings: none.  Independent Review of Additional Tests or Records:  none  Procedures:  Procedure note: Flexible fiberoptic laryngoscopy  Details of the procedure were explained to the patient and all questions were answered.   Procedure:   After anesthetizing the nasal cavity with topical lidocaine and oxymetazoline, the  flexible endoscope was introduced and passed through the nasal cavity into the nasopharynx. The scope was then advanced to the level of the oropharynx, then the hypopharynx and larynx.  Findings:   The posterior soft palate, uvula, tongue base and vallecula were visualized and appeared healthy without mucosal masses or lesions. The epiglottis, aryepiglottic folds, hypopharynx, supraglottis, glottis were visualized. There are multiple papillomatous appearing growths along the anterior commissure, the anterior true cords, may be slight involvement into the subglottis anteriorly and along the petiole of the epiglottis. None of these lesions are obstructing the airway. Vocal fold mobility was intact and symmetric.   Additional findings: None  The scope was withdrawn from the nose. He tolerated the procedure well.   Impression & Plans:  Recurrent respiratory papillomatosis. Recommend laser excision of these with biopsy to make sure there is no malignancy. Recommend try to cut back on caffeine and alcohol as he may have a reflux component as well.

## 2021-01-09 ENCOUNTER — Other Ambulatory Visit (HOSPITAL_COMMUNITY)
Admission: RE | Admit: 2021-01-09 | Discharge: 2021-01-09 | Disposition: A | Discharge status: Home / Self Care | Payer: BC Managed Care – PPO | Source: Ambulatory Visit | Attending: Otolaryngology | Admitting: Otolaryngology

## 2021-01-09 DIAGNOSIS — Z20822 Contact with and (suspected) exposure to covid-19: Secondary | ICD-10-CM | POA: Insufficient documentation

## 2021-01-09 DIAGNOSIS — Z01812 Encounter for preprocedural laboratory examination: Secondary | ICD-10-CM | POA: Diagnosis not present

## 2021-01-09 LAB — SARS CORONAVIRUS 2 (TAT 6-24 HRS): SARS Coronavirus 2: NEGATIVE

## 2021-01-12 ENCOUNTER — Other Ambulatory Visit: Payer: Self-pay

## 2021-01-12 ENCOUNTER — Encounter (HOSPITAL_BASED_OUTPATIENT_CLINIC_OR_DEPARTMENT_OTHER)
Admission: RE | Disposition: A | Discharge status: Home / Self Care | Payer: Self-pay | Source: Home / Self Care | Attending: Otolaryngology

## 2021-01-12 ENCOUNTER — Ambulatory Visit (HOSPITAL_BASED_OUTPATIENT_CLINIC_OR_DEPARTMENT_OTHER): Payer: BC Managed Care – PPO | Admitting: Anesthesiology

## 2021-01-12 ENCOUNTER — Encounter (HOSPITAL_BASED_OUTPATIENT_CLINIC_OR_DEPARTMENT_OTHER): Payer: Self-pay | Admitting: Otolaryngology

## 2021-01-12 ENCOUNTER — Ambulatory Visit (HOSPITAL_BASED_OUTPATIENT_CLINIC_OR_DEPARTMENT_OTHER)
Admission: RE | Admit: 2021-01-12 | Discharge: 2021-01-12 | Disposition: A | Discharge status: Home / Self Care | Payer: BC Managed Care – PPO | Attending: Otolaryngology | Admitting: Otolaryngology

## 2021-01-12 DIAGNOSIS — J3089 Other allergic rhinitis: Secondary | ICD-10-CM | POA: Diagnosis not present

## 2021-01-12 DIAGNOSIS — D141 Benign neoplasm of larynx: Secondary | ICD-10-CM | POA: Diagnosis not present

## 2021-01-12 DIAGNOSIS — J309 Allergic rhinitis, unspecified: Secondary | ICD-10-CM | POA: Diagnosis not present

## 2021-01-12 DIAGNOSIS — K219 Gastro-esophageal reflux disease without esophagitis: Secondary | ICD-10-CM | POA: Diagnosis not present

## 2021-01-12 DIAGNOSIS — Z79899 Other long term (current) drug therapy: Secondary | ICD-10-CM | POA: Insufficient documentation

## 2021-01-12 HISTORY — PX: MICROLARYNGOSCOPY WITH CO2 LASER AND EXCISION OF VOCAL CORD LESION: SHX5970

## 2021-01-12 SURGERY — MICROLARYNGOSCOPY WITH CO2 LASER AND EXCISION OF VOCAL CORD LESION
Anesthesia: General | Site: Throat

## 2021-01-12 MED ORDER — DEXAMETHASONE SODIUM PHOSPHATE 4 MG/ML IJ SOLN
INTRAMUSCULAR | Status: DC | PRN
  Administered 2021-01-12: 5 mg via INTRAVENOUS

## 2021-01-12 MED ORDER — ACETAMINOPHEN 500 MG PO TABS
1000.0000 mg | ORAL_TABLET | Freq: Once | ORAL | Status: AC
  Administered 2021-01-12: 1000 mg via ORAL

## 2021-01-12 MED ORDER — METHYLENE BLUE 0.5 % INJ SOLN
INTRAVENOUS | Status: AC
  Filled 2021-01-12: qty 10

## 2021-01-12 MED ORDER — ROCURONIUM BROMIDE 100 MG/10ML IV SOLN
INTRAVENOUS | Status: DC | PRN
  Administered 2021-01-12: 80 mg via INTRAVENOUS

## 2021-01-12 MED ORDER — PROPOFOL 10 MG/ML IV BOLUS
INTRAVENOUS | Status: DC | PRN
  Administered 2021-01-12: 150 mg via INTRAVENOUS

## 2021-01-12 MED ORDER — LIDOCAINE HCL (CARDIAC) PF 100 MG/5ML IV SOSY
PREFILLED_SYRINGE | INTRAVENOUS | Status: DC | PRN
  Administered 2021-01-12: 60 mg via INTRAVENOUS

## 2021-01-12 MED ORDER — EPINEPHRINE PF 1 MG/ML IJ SOLN
INTRAMUSCULAR | Status: AC
  Filled 2021-01-12: qty 1

## 2021-01-12 MED ORDER — MIDAZOLAM HCL 2 MG/2ML IJ SOLN
INTRAMUSCULAR | Status: AC
  Filled 2021-01-12: qty 2

## 2021-01-12 MED ORDER — PROPOFOL 10 MG/ML IV BOLUS
INTRAVENOUS | Status: AC
  Filled 2021-01-12: qty 20

## 2021-01-12 MED ORDER — OXYCODONE HCL 5 MG PO TABS: 5.0000 mg | ORAL_TABLET | Freq: Once | ORAL | Status: DC | PRN

## 2021-01-12 MED ORDER — FENTANYL CITRATE (PF) 100 MCG/2ML IJ SOLN
INTRAMUSCULAR | Status: AC
  Filled 2021-01-12: qty 2

## 2021-01-12 MED ORDER — PROMETHAZINE HCL 25 MG/ML IJ SOLN: 6.2500 mg | INTRAMUSCULAR | Status: DC | PRN

## 2021-01-12 MED ORDER — LACTATED RINGERS IV SOLN: INTRAVENOUS | Status: DC

## 2021-01-12 MED ORDER — OXYCODONE HCL 5 MG/5ML PO SOLN: 5.0000 mg | Freq: Once | ORAL | Status: DC | PRN

## 2021-01-12 MED ORDER — HYDROMORPHONE HCL 1 MG/ML IJ SOLN
0.2500 mg | INTRAMUSCULAR | Status: DC | PRN
Start: 2021-01-12 — End: 2021-01-12

## 2021-01-12 MED ORDER — ONDANSETRON HCL 4 MG/2ML IJ SOLN
INTRAMUSCULAR | Status: DC | PRN
  Administered 2021-01-12: 4 mg via INTRAVENOUS

## 2021-01-12 MED ORDER — MEPERIDINE HCL 25 MG/ML IJ SOLN: 6.2500 mg | INTRAMUSCULAR | Status: DC | PRN

## 2021-01-12 MED ORDER — LIDOCAINE-EPINEPHRINE 1 %-1:100000 IJ SOLN
INTRAMUSCULAR | Status: AC
  Filled 2021-01-12: qty 1

## 2021-01-12 MED ORDER — AMISULPRIDE (ANTIEMETIC) 5 MG/2ML IV SOLN: 10.0000 mg | Freq: Once | INTRAVENOUS | Status: DC | PRN

## 2021-01-12 MED ORDER — FENTANYL CITRATE (PF) 100 MCG/2ML IJ SOLN
INTRAMUSCULAR | Status: DC | PRN
  Administered 2021-01-12 (×2): 50 ug via INTRAVENOUS

## 2021-01-12 MED ORDER — ACETAMINOPHEN 500 MG PO TABS
ORAL_TABLET | ORAL | Status: AC
  Filled 2021-01-12: qty 2

## 2021-01-12 MED ORDER — SUGAMMADEX SODIUM 200 MG/2ML IV SOLN
INTRAVENOUS | Status: DC | PRN
  Administered 2021-01-12: 200 mg via INTRAVENOUS

## 2021-01-12 SURGICAL SUPPLY — 26 items
CANISTER SUCT 1200ML W/VALVE (MISCELLANEOUS) ×2 IMPLANT
COVER WAND RF STERILE (DRAPES) IMPLANT
GAUZE SPONGE 4X4 12PLY STRL LF (GAUZE/BANDAGES/DRESSINGS) ×4 IMPLANT
GLOVE SURG LTX SZ7.5 (GLOVE) ×2 IMPLANT
GOWN STRL REUS W/ TWL LRG LVL3 (GOWN DISPOSABLE) IMPLANT
GOWN STRL REUS W/ TWL XL LVL3 (GOWN DISPOSABLE) IMPLANT
GOWN STRL REUS W/TWL LRG LVL3 (GOWN DISPOSABLE)
GOWN STRL REUS W/TWL XL LVL3 (GOWN DISPOSABLE)
GUARD TEETH (MISCELLANEOUS) ×1 IMPLANT
MARKER SKIN DUAL TIP RULER LAB (MISCELLANEOUS) IMPLANT
NDL HYPO 18GX1.5 BLUNT FILL (NEEDLE) ×1 IMPLANT
NDL SPNL 22GX7 QUINCKE BK (NEEDLE) IMPLANT
NEEDLE HYPO 18GX1.5 BLUNT FILL (NEEDLE) ×2 IMPLANT
NEEDLE SPNL 22GX7 QUINCKE BK (NEEDLE) IMPLANT
NS IRRIG 1000ML POUR BTL (IV SOLUTION) ×2 IMPLANT
PACK BASIN DAY SURGERY FS (CUSTOM PROCEDURE TRAY) ×2 IMPLANT
PATTIES SURGICAL .5 X3 (DISPOSABLE) ×2 IMPLANT
SHEET MEDIUM DRAPE 40X70 STRL (DRAPES) ×2 IMPLANT
SLEEVE SCD COMPRESS KNEE MED (STOCKING) ×1 IMPLANT
SOLUTION BUTLER CLEAR DIP (MISCELLANEOUS) ×2 IMPLANT
SURGILUBE 2OZ TUBE FLIPTOP (MISCELLANEOUS) IMPLANT
SYR 5ML LL (SYRINGE) ×2 IMPLANT
SYR CONTROL 10ML LL (SYRINGE) IMPLANT
SYR TB 1ML LL NO SAFETY (SYRINGE) ×1 IMPLANT
TOWEL GREEN STERILE FF (TOWEL DISPOSABLE) ×2 IMPLANT
TUBE CONNECTING 20X1/4 (TUBING) ×4 IMPLANT

## 2021-01-12 NOTE — Anesthesia Procedure Notes (Signed)
Procedure Name: Intubation Date/Time: 01/12/2021 8:23 AM Performed by: Signe Colt, CRNA Pre-anesthesia Checklist: Patient identified, Emergency Drugs available, Suction available and Patient being monitored Patient Re-evaluated:Patient Re-evaluated prior to induction Oxygen Delivery Method: Circle system utilized Preoxygenation: Pre-oxygenation with 100% oxygen Induction Type: IV induction Ventilation: Mask ventilation without difficulty Laryngoscope Size: Mac and 3 Grade View: Grade I Tube type: MLT Number of attempts: 1 Airway Equipment and Method: Stylet and Oral airway Placement Confirmation: ETT inserted through vocal cords under direct vision,  positive ETCO2 and breath sounds checked- equal and bilateral Secured at: 21 cm Tube secured with: Tape Dental Injury: Teeth and Oropharynx as per pre-operative assessment

## 2021-01-12 NOTE — Transfer of Care (Signed)
Immediate Anesthesia Transfer of Care Note  Patient: Steven Levans, MD  Procedure(s) Performed: MICROLARYNGOSCOPY WITH BX & LASER ABLATION OF PAPILLOMA (N/A )  Patient Location: PACU  Anesthesia Type:General  Level of Consciousness: awake, alert , oriented and patient cooperative  Airway & Oxygen Therapy: Patient Spontanous Breathing and Patient connected to face mask oxygen  Post-op Assessment: Report given to RN and Post -op Vital signs reviewed and stable  Post vital signs: Reviewed and stable  Last Vitals:  Vitals Value Taken Time  BP 121/80 01/12/21 0924  Temp    Pulse 88 01/12/21 0925  Resp 13 01/12/21 0925  SpO2 100 % 01/12/21 0925  Vitals shown include unvalidated device data.  Last Pain:  Vitals:   01/12/21 0636  TempSrc: Oral  PainSc: 0-No pain      Patients Stated Pain Goal: 4 (51/88/41 [id number])  Complications: No complications documented.

## 2021-01-12 NOTE — Interval H&P Note (Signed)
History and Physical Interval Note:  01/12/2021 7:47 AM  Sydnee Levans, MD  has presented today for surgery, with the diagnosis of Laryngeal papilloma.  The various methods of treatment have been discussed with the patient and family. After consideration of risks, benefits and other options for treatment, the patient has consented to  Procedure(s): MICROLARYNGOSCOPY WITH BX & LASER ABLATION OF PAPILLOMA (N/A) as a surgical intervention.  The patient's history has been reviewed, patient examined, no change in status, stable for surgery.  I have reviewed the patient's chart and labs.  Questions were answered to the patient's satisfaction.     Izora Gala

## 2021-01-12 NOTE — Discharge Instructions (Signed)
Avoid screaming, whispering, singing, straining the voice.    Post Anesthesia Home Care Instructions  Activity: Get plenty of rest for the remainder of the day. A responsible individual must stay with you for 24 hours following the procedure.  For the next 24 hours, DO NOT: -Drive a car -Paediatric nurse -Drink alcoholic beverages -Take any medication unless instructed by your physician -Make any legal decisions or sign important papers.  Meals: Start with liquid foods such as gelatin or soup. Progress to regular foods as tolerated. Avoid greasy, spicy, heavy foods. If nausea and/or vomiting occur, drink only clear liquids until the nausea and/or vomiting subsides. Call your physician if vomiting continues.  Special Instructions/Symptoms: Your throat may feel dry or sore from the anesthesia or the breathing tube placed in your throat during surgery. If this causes discomfort, gargle with warm salt water. The discomfort should disappear within 24 hours.  If you had a scopolamine patch placed behind your ear for the management of post- operative nausea and/or vomiting:  1. The medication in the patch is effective for 72 hours, after which it should be removed.  Wrap patch in a tissue and discard in the trash. Wash hands thoroughly with soap and water. 2. You may remove the patch earlier than 72 hours if you experience unpleasant side effects which may include dry mouth, dizziness or visual disturbances. 3. Avoid touching the patch. Wash your hands with soap and water after contact with the patch.

## 2021-01-12 NOTE — Anesthesia Preprocedure Evaluation (Signed)
Anesthesia Evaluation  Patient identified by MRN, date of birth, ID band Patient awake    Reviewed: Allergy & Precautions, NPO status , Patient's Chart, lab work & pertinent test results  Airway Mallampati: II  TM Distance: >3 FB Neck ROM: Full    Dental no notable dental hx.    Pulmonary neg pulmonary ROS,    Pulmonary exam normal breath sounds clear to auscultation       Cardiovascular negative cardio ROS Normal cardiovascular exam Rhythm:Regular Rate:Normal     Neuro/Psych negative neurological ROS  negative psych ROS   GI/Hepatic Neg liver ROS, GERD  ,  Endo/Other  negative endocrine ROS  Renal/GU negative Renal ROS  negative genitourinary   Musculoskeletal negative musculoskeletal ROS (+)   Abdominal   Peds negative pediatric ROS (+)  Hematology negative hematology ROS (+)   Anesthesia Other Findings   Reproductive/Obstetrics negative OB ROS                             Anesthesia Physical Anesthesia Plan  ASA: II  Anesthesia Plan: General   Post-op Pain Management:    Induction: Intravenous  PONV Risk Score and Plan: 2 and Ondansetron, Midazolam and Treatment may vary due to age or medical condition  Airway Management Planned: Oral ETT  Additional Equipment:   Intra-op Plan:   Post-operative Plan: Extubation in OR  Informed Consent: I have reviewed the patients History and Physical, chart, labs and discussed the procedure including the risks, benefits and alternatives for the proposed anesthesia with the patient or authorized representative who has indicated his/her understanding and acceptance.     Dental advisory given  Plan Discussed with: CRNA  Anesthesia Plan Comments:         Anesthesia Quick Evaluation  

## 2021-01-12 NOTE — Op Note (Signed)
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 Following induction of general endotracheal anesthesia, the table was turned 90 and the patient was draped in a standard fashion.  Wet eye pads were placed.  A maxillary tooth protector was used.  The Jako laryngoscope was entered into the oral cavity used to visualize the larynx.  It was then attached to the [place] stand with the suspension apparatus.  The operating microscope was brought into view.  A rod telescope was used to take photographs before and after the laser treatment.  There is extensive papillomatous disease identified in the anterior commissure at the glottic level, subglottis and just barely into the supraglottic level.  There is also extensive disease present in the left ventricle.  There was small patches of papilloma along the right anterior true cord.  Multiple biopsies were taken and sent for pathologic evaluation.  The remainder of the disease was ablated using the CO2 laser at 2 W continuous power.  Topical adrenaline on a pledget was used periodically for hemostasis.  No other lesions were identified.  The scope was removed and the patient was awakened extubated and transferred to recovery in stable condition.    PATIENT DISPOSITION:  To PACU, stable

## 2021-01-12 NOTE — Anesthesia Postprocedure Evaluation (Signed)
Anesthesia Post Note  Patient: Steven Levans, MD  Procedure(s) Performed: MICROLARYNGOSCOPY WITH BIOPSY & LASER ABLATION OF PAPILLOMA (N/A Throat)     Patient location during evaluation: PACU Anesthesia Type: General Level of consciousness: awake and alert Pain management: pain level controlled Vital Signs Assessment: post-procedure vital signs reviewed and stable Respiratory status: spontaneous breathing, nonlabored ventilation and respiratory function stable Cardiovascular status: blood pressure returned to baseline and stable Postop Assessment: no apparent nausea or vomiting Anesthetic complications: no   No complications documented.  Last Vitals:  Vitals:   01/12/21 0945 01/12/21 1000  BP: 121/71 111/66  Pulse: 74 70  Resp: 12 14  Temp:    SpO2: 100% 100%    Last Pain:  Vitals:   01/12/21 1020  TempSrc:   PainSc: [place]

## 2021-01-13 ENCOUNTER — Encounter (HOSPITAL_BASED_OUTPATIENT_CLINIC_OR_DEPARTMENT_OTHER): Payer: Self-pay | Admitting: Otolaryngology

## 2021-01-13 LAB — SURGICAL PATHOLOGY

## 2021-01-23 ENCOUNTER — Other Ambulatory Visit: Payer: Self-pay | Admitting: Otolaryngology

## 2021-01-23 DIAGNOSIS — D141 Benign neoplasm of larynx: Secondary | ICD-10-CM

## 2021-03-02 ENCOUNTER — Ambulatory Visit
Admission: RE | Admit: 2021-03-02 | Discharge: 2021-03-02 | Disposition: A | Discharge status: Home / Self Care | Payer: BC Managed Care – PPO | Source: Ambulatory Visit | Attending: Otolaryngology | Admitting: Otolaryngology

## 2021-03-02 DIAGNOSIS — J382 Nodules of vocal cords: Secondary | ICD-10-CM | POA: Diagnosis not present

## 2021-03-02 DIAGNOSIS — D141 Benign neoplasm of larynx: Secondary | ICD-10-CM

## 2021-03-02 IMAGING — CT CT NECK W/ CM
5 of 6 series · 14 of 35 positions shown, 16 images · IV contrast (iopamidol)
Comparison: None.

CLINICAL DATA: Removal of local cord papillomas x3 2 months ago

EXAM:
CT NECK WITH CONTRAST
TECHNIQUE: Multidetector CT imaging of the neck was performed using the
standard protocol following the bolus administration of intravenous
contrast.
CONTRAST:  75mL 9LSRJE-L33 IOPAMIDOL (9LSRJE-L33) INJECTION 61%

[Series 2: neck 2.00 br40 s3 st/ no angle · axial · 0.47mm/px · z∈[-794,-690]mm · 2 of 156 slices shown, 3 images]
[im 52/156  soft-tissue]
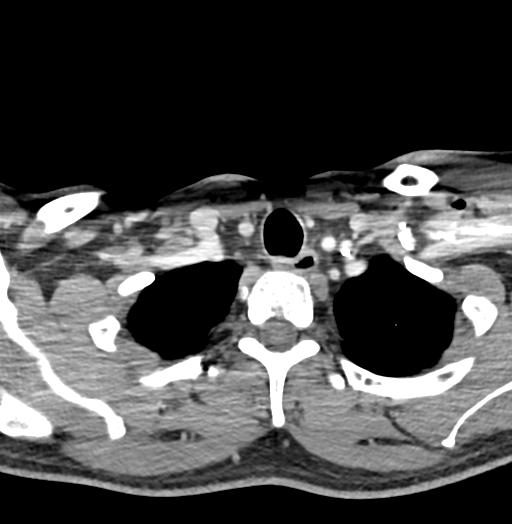
[im 52/156  bone]
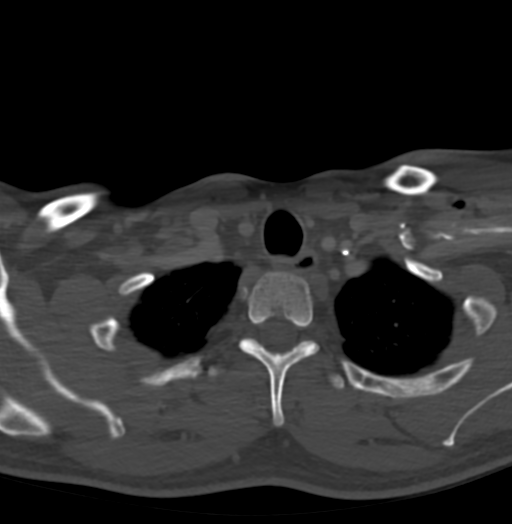
[im 104/156  bone]
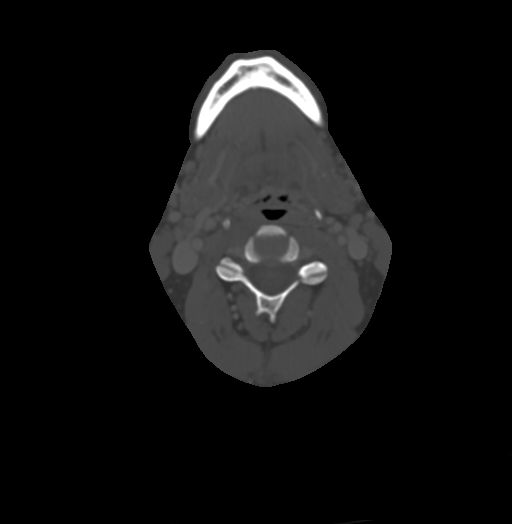

[Series 4: neck 2.00 br60 s3 bone/ no angle · axial · 0.47mm/px · z∈[-794,-690]mm · 2 of 156 slices shown]
[im 52/156  bone]
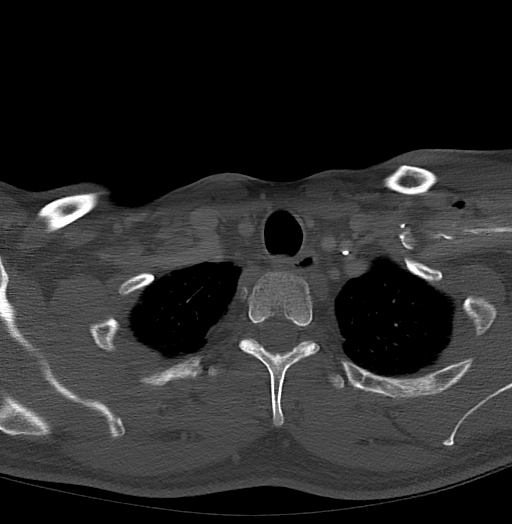
[im 104/156  bone]
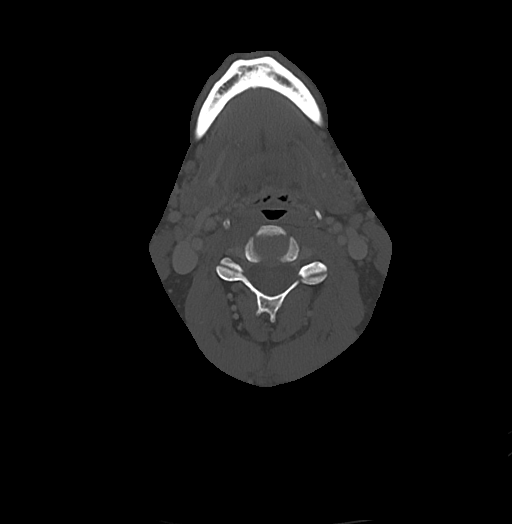

[Series 6: neck 2.00 br36 s3 angled axial (person_name) · axial · 0.47mm/px · z∈[-794,-690]mm · 2 of 156 slices shown]
[im 52/156  bone]
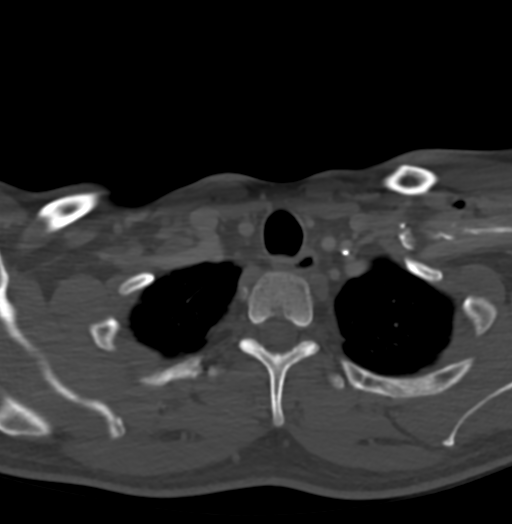
[im 104/156  bone]
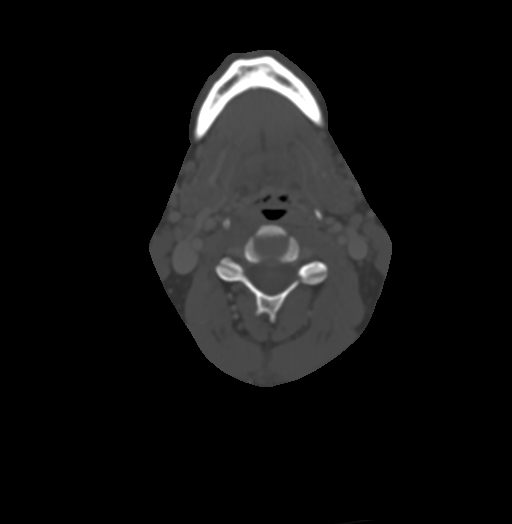

[Series 10: neck 2.00 br40 s3 (person_name) · coronal · 0.47mm/px · 3 of 139 slices shown (1 of 2)]
[im 28/139  bone]
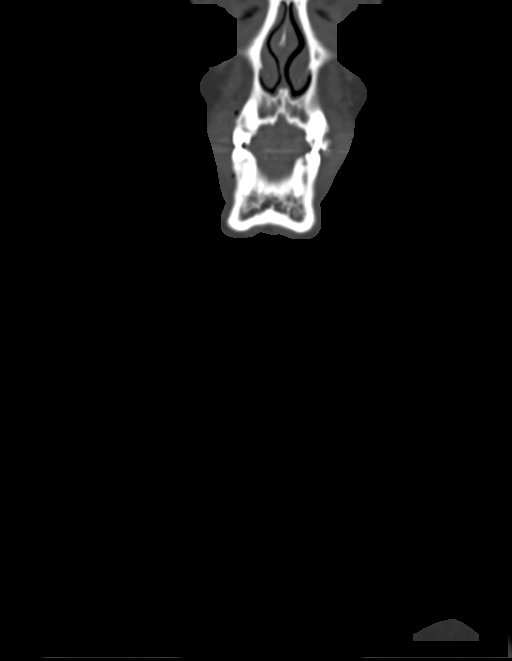
[im 56/139  bone]
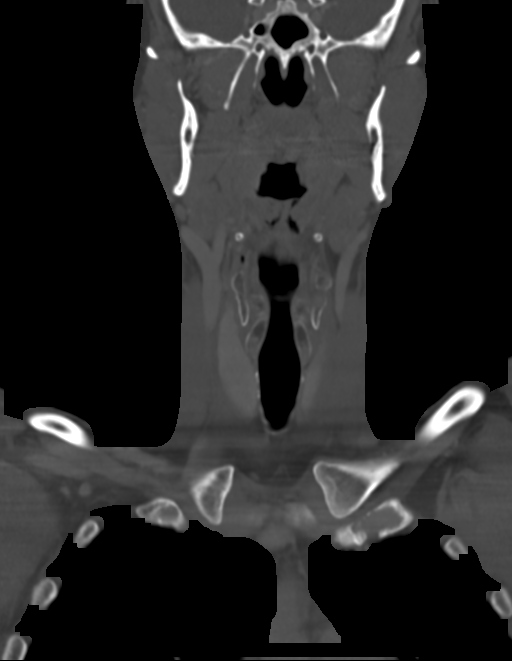
[im 83/139  bone]
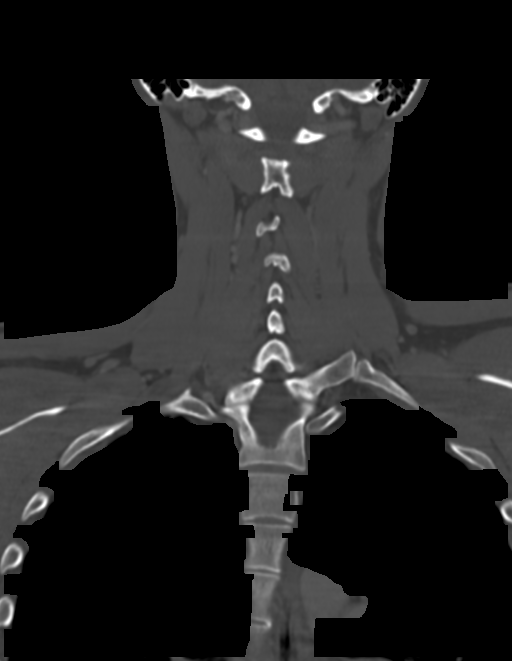

[Series 12: neck 2.00 br40 s3 (person_name) · sagittal · 0.48mm/px · 5 of 121 slices shown, 6 images (2 of 2)]
[im 41/121  bone]
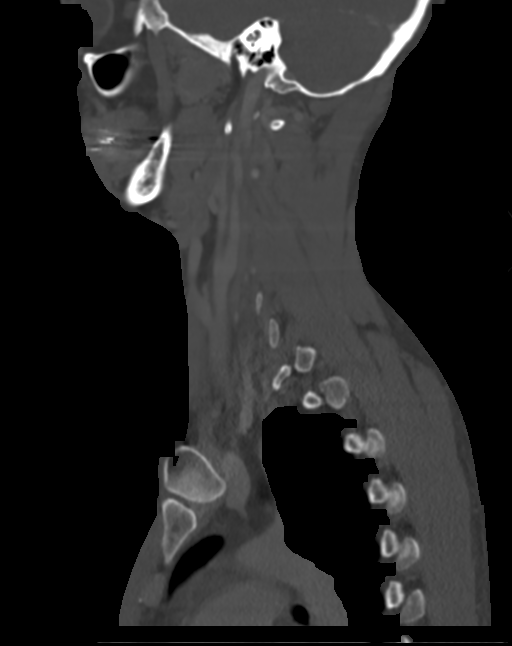
[im 51/121  bone]
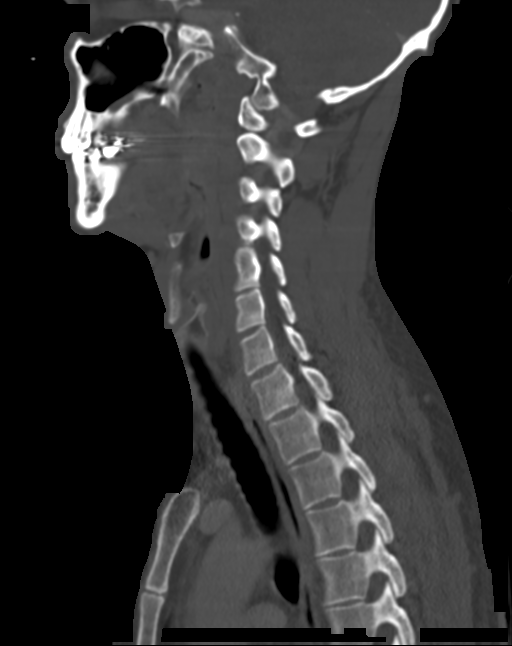
[im 61/121  soft-tissue]
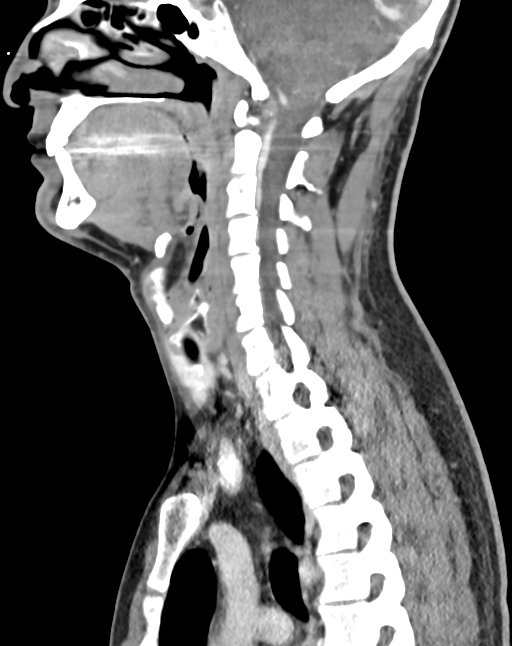
[im 61/121  bone]
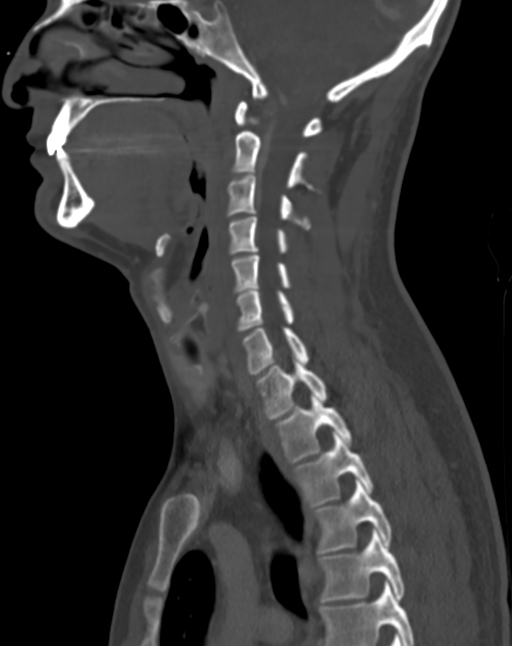
[im 71/121  bone]
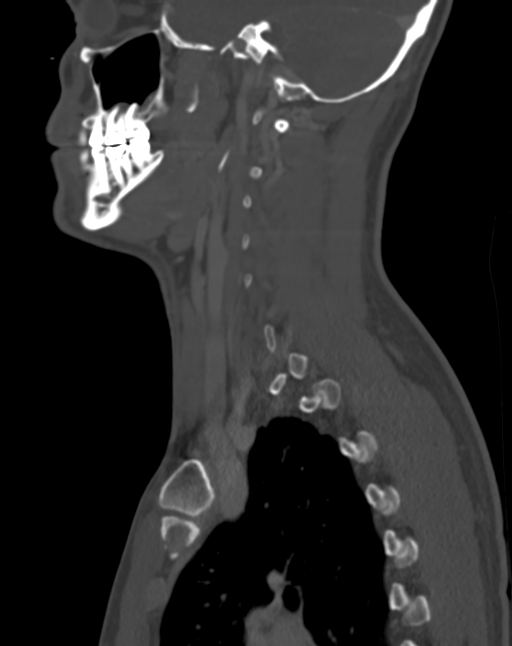
[im 81/121  bone]
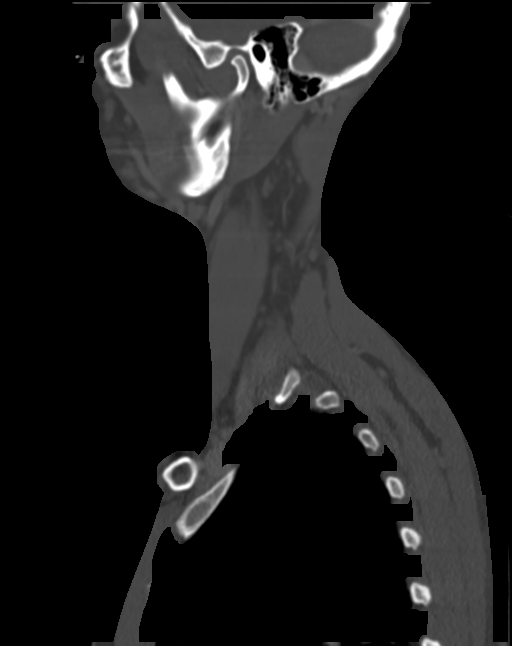

[14 of 35 positions shown; findings below may reference images not displayed]

FINDINGS: Pharynx and larynx: In the anterior supraglottic midline is a 4 mm
nodular appearance on axial images that is more sessile and ridge
like on sagittal images. No submucosal swelling or submucosal mass.

Salivary glands: No inflammation, mass, or stone.

Thyroid: Normal.

Lymph nodes: None enlarged or abnormal density.

Vascular: Negative.

Limited intracranial: Negative.

Visualized orbits: Negative.

Mastoids and visualized paranasal sinuses: Clear.

Skeleton: No acute or aggressive process.

Upper chest: Negative.
IMPRESSION: Equivocal for polyp in the anterior supraglottic midline.

## 2021-03-02 MED ORDER — IOPAMIDOL (ISOVUE-300) INJECTION 61%
75.0000 mL | Freq: Once | INTRAVENOUS | Status: AC | PRN
  Administered 2021-03-02: 75 mL via INTRAVENOUS

## 2021-03-23 DIAGNOSIS — D141 Benign neoplasm of larynx: Secondary | ICD-10-CM | POA: Diagnosis not present

## 2021-04-22 DIAGNOSIS — J3089 Other allergic rhinitis: Secondary | ICD-10-CM | POA: Diagnosis not present

## 2021-04-22 DIAGNOSIS — J301 Allergic rhinitis due to pollen: Secondary | ICD-10-CM | POA: Diagnosis not present

## 2021-04-22 DIAGNOSIS — J310 Chronic rhinitis: Secondary | ICD-10-CM | POA: Diagnosis not present

## 2021-04-22 DIAGNOSIS — J3081 Allergic rhinitis due to animal (cat) (dog) hair and dander: Secondary | ICD-10-CM | POA: Diagnosis not present

## 2021-05-20 DIAGNOSIS — Z0000 Encounter for general adult medical examination without abnormal findings: Secondary | ICD-10-CM | POA: Diagnosis not present

## 2021-05-20 DIAGNOSIS — Z79899 Other long term (current) drug therapy: Secondary | ICD-10-CM | POA: Diagnosis not present

## 2021-05-20 DIAGNOSIS — R6889 Other general symptoms and signs: Secondary | ICD-10-CM | POA: Diagnosis not present

## 2023-05-23 DIAGNOSIS — Z23 Encounter for immunization: Secondary | ICD-10-CM | POA: Diagnosis not present

## 2023-05-23 DIAGNOSIS — Z0000 Encounter for general adult medical examination without abnormal findings: Secondary | ICD-10-CM | POA: Diagnosis not present

## 2023-05-24 DIAGNOSIS — K909 Intestinal malabsorption, unspecified: Secondary | ICD-10-CM | POA: Diagnosis not present

## 2023-06-03 DIAGNOSIS — Z1212 Encounter for screening for malignant neoplasm of rectum: Secondary | ICD-10-CM | POA: Diagnosis not present

## 2023-06-03 DIAGNOSIS — Z1211 Encounter for screening for malignant neoplasm of colon: Secondary | ICD-10-CM | POA: Diagnosis not present

## 2023-08-09 DIAGNOSIS — R195 Other fecal abnormalities: Secondary | ICD-10-CM | POA: Diagnosis not present

## 2023-10-12 DIAGNOSIS — K5730 Diverticulosis of large intestine without perforation or abscess without bleeding: Secondary | ICD-10-CM | POA: Diagnosis not present

## 2023-10-12 DIAGNOSIS — Z1211 Encounter for screening for malignant neoplasm of colon: Secondary | ICD-10-CM | POA: Diagnosis not present

## 2023-10-12 DIAGNOSIS — D122 Benign neoplasm of ascending colon: Secondary | ICD-10-CM | POA: Diagnosis not present

## 2023-10-12 DIAGNOSIS — R195 Other fecal abnormalities: Secondary | ICD-10-CM | POA: Diagnosis not present

## 2023-10-12 DIAGNOSIS — D123 Benign neoplasm of transverse colon: Secondary | ICD-10-CM | POA: Diagnosis not present

## 2023-10-12 DIAGNOSIS — K648 Other hemorrhoids: Secondary | ICD-10-CM | POA: Diagnosis not present

## 2023-12-05 DIAGNOSIS — L820 Inflamed seborrheic keratosis: Secondary | ICD-10-CM | POA: Diagnosis not present

## 2024-05-23 DIAGNOSIS — J3089 Other allergic rhinitis: Secondary | ICD-10-CM | POA: Diagnosis not present

## 2024-05-23 DIAGNOSIS — Z0000 Encounter for general adult medical examination without abnormal findings: Secondary | ICD-10-CM | POA: Diagnosis not present

## 2024-05-23 DIAGNOSIS — R6889 Other general symptoms and signs: Secondary | ICD-10-CM | POA: Diagnosis not present

## 2024-05-23 DIAGNOSIS — E7800 Pure hypercholesterolemia, unspecified: Secondary | ICD-10-CM | POA: Diagnosis not present

## 2024-05-23 DIAGNOSIS — K219 Gastro-esophageal reflux disease without esophagitis: Secondary | ICD-10-CM | POA: Diagnosis not present

## 2024-05-28 DIAGNOSIS — D141 Benign neoplasm of larynx: Secondary | ICD-10-CM | POA: Diagnosis not present

## 2024-05-28 DIAGNOSIS — K219 Gastro-esophageal reflux disease without esophagitis: Secondary | ICD-10-CM | POA: Diagnosis not present
